# Patient Record
Sex: Male | Born: 1995 | Race: White | Hispanic: No | Marital: Single | Smoking: Never smoker
Health system: Southern US, Community
[De-identification: ages and names within clinical notes are randomized; demographics above are authoritative.]

## PROBLEM LIST (undated history)

## (undated) HISTORY — PX: WISDOM TOOTH EXTRACTION: SHX21

## (undated) HISTORY — PX: OTHER SURGICAL HISTORY: SHX169

---
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
## 2016-04-13 ENCOUNTER — Encounter (HOSPITAL_COMMUNITY): Payer: Self-pay | Admitting: Emergency Medicine

## 2016-04-13 ENCOUNTER — Emergency Department (HOSPITAL_COMMUNITY)
Admission: EM | Admit: 2016-04-13 | Discharge: 2016-04-13 | Disposition: A | Discharge status: Home / Self Care | Payer: 59 | Attending: Emergency Medicine | Admitting: Emergency Medicine

## 2016-04-13 DIAGNOSIS — R61 Generalized hyperhidrosis: Secondary | ICD-10-CM | POA: Diagnosis not present

## 2016-04-13 DIAGNOSIS — M791 Myalgia: Secondary | ICD-10-CM | POA: Diagnosis not present

## 2016-04-13 DIAGNOSIS — R55 Syncope and collapse: Secondary | ICD-10-CM | POA: Diagnosis not present

## 2016-04-13 DIAGNOSIS — R200 Anesthesia of skin: Secondary | ICD-10-CM | POA: Insufficient documentation

## 2016-04-13 LAB — CBC
HEMATOCRIT: 48.5 % (ref 39.0–52.0)
HEMOGLOBIN: 16.9 g/dL (ref 13.0–17.0)
MCH: 29.9 pg (ref 26.0–34.0)
MCHC: 34.8 g/dL (ref 30.0–36.0)
MCV: 85.7 fL (ref 78.0–100.0)
Platelets: 266 10*3/uL (ref 150–400)
RBC: 5.66 MIL/uL (ref 4.22–5.81)
RDW: 12.5 % (ref 11.5–15.5)
WBC: 18.5 10*3/uL — AB (ref 4.0–10.5)

## 2016-04-13 LAB — URINALYSIS, ROUTINE W REFLEX MICROSCOPIC
Bilirubin Urine: NEGATIVE
Glucose, UA: NEGATIVE mg/dL
Hgb urine dipstick: NEGATIVE
KETONES UR: NEGATIVE mg/dL
LEUKOCYTES UA: NEGATIVE
NITRITE: NEGATIVE
PH: 5 (ref 5.0–8.0)
Protein, ur: NEGATIVE mg/dL
SPECIFIC GRAVITY, URINE: 1.02 (ref 1.005–1.030)

## 2016-04-13 LAB — BASIC METABOLIC PANEL
ANION GAP: 6 (ref 5–15)
BUN: 15 mg/dL (ref 6–20)
CO2: 27 mmol/L (ref 22–32)
Calcium: 9.5 mg/dL (ref 8.9–10.3)
Chloride: 107 mmol/L (ref 101–111)
Creatinine, Ser: 1.1 mg/dL (ref 0.61–1.24)
GFR calc Af Amer: >60 mL/min (ref >60)
Glucose, Bld: 92 mg/dL (ref 65–99)
POTASSIUM: 4.3 mmol/L (ref 3.5–5.1)
SODIUM: 140 mmol/L (ref 135–145)

## 2016-04-13 LAB — CBG MONITORING, ED: GLUCOSE-CAPILLARY: 90 mg/dL (ref 65–99)

## 2016-04-13 NOTE — Discharge Instructions (Signed)
Do not exercise until evaluated by your family physician.

## 2016-04-13 NOTE — ED Triage Notes (Addendum)
Pt from home with his parents. Pt states he went to donate plasma today around 1330. Pt then went to the gym. Pt states at the gym, he began feeling tingling in his arms. Pt states he is unsure he actually experienced LOC or not. Per parents, EMS stated at the scene that he had experienced LOC.  Pt denies headache, dizziness, or other symptoms at this time  Pt states he also has many tick bites on his legs

## 2016-04-14 NOTE — ED Provider Notes (Signed)
[facility]-EMERGENCY DEPT Provider Note   CSN: [id number] Arrival date & time: 04/13/16  1848     History   Chief Complaint Chief Complaint  Patient presents with  . Loss of Consciousness    HPI Isaac Schmidt is a 20 y.o. male.  20 yo M with a chief complaint of a syncopal event. Patient donated plasma then went to the gym. While the gym patient was exerting himself and felt sick to his stomach. He went out a chair and then had to really focus on his breathing noticed that bilateral arms started having tingling and cramps. He then felt that time passed very quickly. EMS was notified and patient was noted to be pale and diaphoretic bradycardic and hypotensive on arrival. By my exam patient is asymptomatic.   The history is provided by the patient.  Loss of Consciousness   This is a new problem. The current episode started 3 to 5 hours ago. The problem occurs constantly. The problem has not changed since onset.He lost consciousness for a period of less than one minute. Associated symptoms include diaphoresis. Pertinent negatives include abdominal pain, chest pain, confusion, congestion, fever, headaches, palpitations and vomiting. He has tried nothing for the symptoms. The treatment provided significant relief.    History reviewed. No pertinent past medical history.  Patient Active Problem List   Diagnosis Date Noted  . Acute appendicitis 06/01/2011    Past Surgical History:  Procedure Laterality Date  . LAPAROSCOPIC APPENDECTOMY  06/01/2011   Procedure: APPENDECTOMY LAPAROSCOPIC;  Surgeon: Velora Heckler, MD;  Location: [facility]S;  Service: General;  Laterality: N/A;  . tubes in ear     both  . WISDOM TOOTH EXTRACTION    . WISDOM TOOTH EXTRACTION         Home Medications    Prior to Admission medications   Medication Sig Start Date End Date Taking? Authorizing Provider  MELATONIN PO Take 1 tablet by mouth at bedtime.   Yes Historical Provider, MD    Family History Family  History  Problem Relation Age of Onset  . Hypertension Mother   . Asthma Father     Social History Social History  Substance Use Topics  . Smoking status: Never Smoker  . Smokeless tobacco: Never Used  . Alcohol use No     Allergies   Amoxicillin-pot clavulanate   Review of Systems Review of Systems  Constitutional: Positive for diaphoresis. Negative for chills and fever.  HENT: Negative for congestion and facial swelling.   Eyes: Negative for discharge and visual disturbance.  Respiratory: Negative for shortness of breath.   Cardiovascular: Positive for syncope. Negative for chest pain and palpitations.  Gastrointestinal: Negative for abdominal pain, diarrhea and vomiting.  Musculoskeletal: Positive for myalgias. Negative for arthralgias.  Skin: Negative for color change and rash.  Neurological: Positive for syncope and numbness (tingling to all four extremities). Negative for tremors and headaches.  Psychiatric/Behavioral: Negative for confusion and dysphoric mood.     Physical Exam Updated Vital Signs BP 114/79 (BP Location: Right Arm)   Pulse (!) 56   Temp 98.3 F (36.8 C) (Oral)   Resp 18   Ht 6' (1.829 m)   Wt 207 lb (93.9 kg)   SpO2 98%   BMI 28.07 kg/m   Physical Exam  Constitutional: He is oriented to person, place, and time. He appears well-developed and well-nourished.  HENT:  Head: Normocephalic and atraumatic.  Eyes: EOM are normal. Pupils are equal, round, and reactive to light.  Neck: Normal range of motion. Neck supple. No JVD present.  Cardiovascular: Normal rate and regular rhythm.  Exam reveals no gallop and no friction rub.   No murmur heard. Pulmonary/Chest: No respiratory distress. He has no wheezes.  Abdominal: He exhibits no distension. There is no rebound and no guarding.  Musculoskeletal: Normal range of motion.  Neurological: He is alert and oriented to person, place, and time.  Skin: No rash noted. No pallor.  Psychiatric: He has  a normal mood and affect. His behavior is normal.  Nursing note and vitals reviewed.    ED Treatments / Results  Labs (all labs ordered are listed, but only abnormal results are displayed) Labs Reviewed  CBC - Abnormal; Notable for the following:       Result Value   WBC 18.5 (*)    All other components within normal limits  BASIC METABOLIC PANEL  URINALYSIS, ROUTINE W REFLEX MICROSCOPIC (NOT AT [facility])  CBG MONITORING, ED    EKG  EKG Interpretation  Date/Time:  Thursday April 13 2016 19:02:06 EDT Ventricular Rate:  87 PR Interval:    QRS Duration: 77 QT Interval:  342 QTC Calculation: 412 R Axis:   79 Text Interpretation:  Sinus rhythm ST elev, probable normal early repol pattern now wpw, prolonged qt brugada.  No old tracing to compare Confirmed by [facility] MD, DANIEL [phone]([id number]) on 04/13/2016 10:05:00 PM       Radiology No results found.  Procedures Procedures (including critical care time)  Medications Ordered in ED Medications - No data to display   Initial Impression / Assessment and Plan / ED Course  I have reviewed the triage vital signs and the nursing notes.  Pertinent labs & imaging results that were available during my care of the patient were reviewed by me and considered in my medical decision making (see chart for details).  Clinical Course    20 yo M With a chief complaint of syncopal event. By history it sounds like a panic attack. EKG with no significant findings. As patient had some symptoms during exercise recommended no further exercise until cleared by his family physician.  12:12 AM:  I have discussed the diagnosis/risks/treatment options with the patient and family and believe the pt to be eligible for discharge home to follow-up with PCP. We also discussed returning to the ED immediately if new or worsening sx occur. We discussed the sx which are most concerning (e.g., sudden worsening pain, fever, inability to tolerate by mouth) that  necessitate immediate return. Medications administered to the patient during their visit and any new prescriptions provided to the patient are listed below.  Medications given during this visit Medications - No data to display   The patient appears reasonably screen and/or stabilized for discharge and I doubt any other medical condition or other [facility] requiring further screening, evaluation, or treatment in the ED at this time prior to discharge.    Final Clinical Impressions(s) / ED Diagnoses   Final diagnoses:  Vasovagal syncope    New Prescriptions Discharge Medication List as of 04/13/2016 11:07 PM       Melene Planan Nyala Kirchner, DO 04/14/16 0012

## 2018-12-07 DIAGNOSIS — E669 Obesity, unspecified: Secondary | ICD-10-CM | POA: Diagnosis not present

## 2018-12-07 DIAGNOSIS — S30861A Insect bite (nonvenomous) of abdominal wall, initial encounter: Secondary | ICD-10-CM | POA: Diagnosis not present

## 2018-12-07 DIAGNOSIS — R21 Rash and other nonspecific skin eruption: Secondary | ICD-10-CM | POA: Diagnosis not present

## 2018-12-07 DIAGNOSIS — W57XXXA Bitten or stung by nonvenomous insect and other nonvenomous arthropods, initial encounter: Secondary | ICD-10-CM | POA: Diagnosis not present

## 2018-12-07 DIAGNOSIS — Z79899 Other long term (current) drug therapy: Secondary | ICD-10-CM | POA: Diagnosis not present

## 2019-03-14 DIAGNOSIS — L739 Follicular disorder, unspecified: Secondary | ICD-10-CM | POA: Diagnosis not present

## 2019-03-14 DIAGNOSIS — R21 Rash and other nonspecific skin eruption: Secondary | ICD-10-CM | POA: Diagnosis not present

## 2020-01-23 DIAGNOSIS — M79672 Pain in left foot: Secondary | ICD-10-CM | POA: Diagnosis not present

## 2020-02-10 DIAGNOSIS — M79672 Pain in left foot: Secondary | ICD-10-CM | POA: Diagnosis not present

## 2020-03-10 DIAGNOSIS — J45990 Exercise induced bronchospasm: Secondary | ICD-10-CM | POA: Diagnosis not present

## 2020-03-10 DIAGNOSIS — L729 Follicular cyst of the skin and subcutaneous tissue, unspecified: Secondary | ICD-10-CM | POA: Diagnosis not present

## 2020-05-24 DIAGNOSIS — L720 Epidermal cyst: Secondary | ICD-10-CM | POA: Diagnosis not present

## 2020-06-07 DIAGNOSIS — L7211 Pilar cyst: Secondary | ICD-10-CM | POA: Diagnosis not present

## 2021-05-10 DIAGNOSIS — S8002XD Contusion of left knee, subsequent encounter: Secondary | ICD-10-CM | POA: Diagnosis not present

## 2021-07-21 ENCOUNTER — Other Ambulatory Visit (HOSPITAL_BASED_OUTPATIENT_CLINIC_OR_DEPARTMENT_OTHER): Payer: Self-pay | Admitting: Orthopaedic Surgery

## 2021-07-22 ENCOUNTER — Other Ambulatory Visit (HOSPITAL_BASED_OUTPATIENT_CLINIC_OR_DEPARTMENT_OTHER): Payer: Self-pay | Admitting: Orthopaedic Surgery

## 2021-07-22 DIAGNOSIS — M25562 Pain in left knee: Secondary | ICD-10-CM

## 2021-07-26 ENCOUNTER — Ambulatory Visit (HOSPITAL_BASED_OUTPATIENT_CLINIC_OR_DEPARTMENT_OTHER)
Admission: RE | Admit: 2021-07-26 | Discharge: 2021-07-26 | Disposition: A | Discharge status: Home / Self Care | Payer: BC Managed Care – PPO | Source: Ambulatory Visit | Attending: Orthopaedic Surgery | Admitting: Orthopaedic Surgery

## 2021-07-26 ENCOUNTER — Ambulatory Visit (INDEPENDENT_AMBULATORY_CARE_PROVIDER_SITE_OTHER): Payer: BC Managed Care – PPO | Admitting: Orthopaedic Surgery

## 2021-07-26 ENCOUNTER — Other Ambulatory Visit: Payer: Self-pay

## 2021-07-26 DIAGNOSIS — M2392 Unspecified internal derangement of left knee: Secondary | ICD-10-CM | POA: Diagnosis not present

## 2021-07-26 DIAGNOSIS — M25562 Pain in left knee: Secondary | ICD-10-CM | POA: Insufficient documentation

## 2021-07-26 NOTE — Progress Notes (Signed)
Chief Complaint: Left knee pain     History of Present Illness:    Isaac Schmidt is a 26 y.o. male presents with medial based left knee pain since he was playing basketball and mid September.  At that time he states that the knee felt uncomfortable an hour after.  His knee has been focally medially based.  He denies any popping or specific clicking.  He does state that the knee is sore when he goes into full flexion of the knee.  He was prescribed physical therapy but was not able to attend.  He has been taking anti-inflammatories including meloxicam which is significantly helpful.  He denies any feeling of instability.  He is quite active and enjoys pickup basketball.  He works for [employer] History:   None  PMH/PSH/Family History/Social History/Meds/Allergies:   No past medical history on file. Past Surgical History:  Procedure Laterality Date   LAPAROSCOPIC APPENDECTOMY  06/01/2011   Procedure: APPENDECTOMY LAPAROSCOPIC;  Surgeon: Velora Heckler, MD;  Location: [facility];  Service: General;  Laterality: N/A;   tubes in ear     both   WISDOM TOOTH EXTRACTION     WISDOM TOOTH EXTRACTION     Social History   Socioeconomic History   Marital status: Single    Spouse name: Not on file   Number of children: Not on file   Years of education: Not on file   Highest education level: Not on file  Occupational History   Not on file  Tobacco Use   Smoking status: Never   Smokeless tobacco: Never  Substance and Sexual Activity   Alcohol use: No   Drug use: No   Sexual activity: Not Currently  Other Topics Concern   Not on file  Social History Narrative   Not on file   Social Determinants of Health   Financial Resource Strain: Not on file  Food Insecurity: Not on file  Transportation Needs: Not on file  Physical Activity: Not on file  Stress: Not on file  Social Connections: Not on file   Family History  Problem Relation Age  of Onset   Hypertension Mother    Asthma Father    Allergies  Allergen Reactions   Amoxicillin-Pot Clavulanate Rash   Current Outpatient Medications  Medication Sig Dispense Refill   MELATONIN PO Take 1 tablet by mouth at bedtime.     No current facility-administered medications for this visit.   No results found.  Review of Systems:   A ROS was performed including pertinent positives and negatives as documented in the HPI.  Physical Exam :   Constitutional: NAD and appears stated age Neurological: Alert and oriented Psych: Appropriate affect and cooperative There were no vitals taken for this visit.   Comprehensive Musculoskeletal Exam:     Musculoskeletal Exam  Gait Normal  Alignment Mild varus   Right Left  Inspection Normal Normal  Palpation    Tenderness None Medial joint  Crepitus None None  Effusion None Trace  Range of Motion    Extension 0 0  Flexion 135 135  Strength    Extension 5/5 5/5  Flexion 5/5 5/5  Ligament Exam     Generalized Laxity No No  Lachman Negative Negative   Pivot Shift Negative Negative  Anterior Drawer  Negative Negative  Valgus at 0 Negative Negative  Valgus at 20 Negative Negative  Varus at 0 0 0  Varus at 20   0 0  Posterior Drawer at 90 0 0  Vascular/Lymphatic Exam    Edema None None  Venous Stasis Changes No No  Distal Circulation Normal Normal  Neurologic    Light Touch Sensation Intact Intact  Special Tests: Tenderness about posterior medial joint space with deep flexion     Imaging:   Xray (4 views left knee): Normal   I personally reviewed and interpreted the radiographs.   Assessment:   26 year old male with left medial based knee pain after playing basketball in September.  Since this time the pain has not gone away.  He has trialed rest as well as activity restriction.  He is trialed NSAIDs including meloxicam without relief.  Given the fact he continues to be quite limited in activities and is not able to  play sport such as basketball would like to obtain an MRI of the left knee to further assess any type of underlying meniscal injury  Plan :    -Plan for left knee MRI  I believe that advance imaging in the form of an MRI is indicated for the following reasons: -Xrays images were obtained and not diagnostic -The patient has failed treatment modalities including ice, activity restriction, NSAIDs -The following worrisome symptoms are present on history and exam: Focal medial joint space tenderness       I personally saw and evaluated the patient, and participated in the management and treatment plan.  Huel Cote, MD Attending Physician, Orthopedic Surgery  This document was dictated using Dragon voice recognition software. A reasonable attempt at proof reading has been made to minimize errors.

## 2021-08-13 ENCOUNTER — Ambulatory Visit
Admission: RE | Admit: 2021-08-13 | Discharge: 2021-08-13 | Disposition: A | Discharge status: Home / Self Care | Payer: BC Managed Care – PPO | Source: Ambulatory Visit | Attending: Orthopaedic Surgery | Admitting: Orthopaedic Surgery

## 2021-08-13 ENCOUNTER — Other Ambulatory Visit: Payer: Self-pay

## 2021-08-13 DIAGNOSIS — M25562 Pain in left knee: Secondary | ICD-10-CM

## 2021-08-24 ENCOUNTER — Ambulatory Visit (HOSPITAL_BASED_OUTPATIENT_CLINIC_OR_DEPARTMENT_OTHER): Payer: BC Managed Care – PPO | Admitting: Orthopaedic Surgery

## 2021-08-25 ENCOUNTER — Ambulatory Visit (INDEPENDENT_AMBULATORY_CARE_PROVIDER_SITE_OTHER): Payer: BC Managed Care – PPO | Admitting: Orthopaedic Surgery

## 2021-08-25 ENCOUNTER — Other Ambulatory Visit: Payer: Self-pay

## 2021-08-25 DIAGNOSIS — E8889 Other specified metabolic disorders: Secondary | ICD-10-CM

## 2021-08-25 DIAGNOSIS — M25562 Pain in left knee: Secondary | ICD-10-CM

## 2021-08-25 MED ORDER — TRIAMCINOLONE ACETONIDE 40 MG/ML IJ SUSP
80.0000 mg | INTRAMUSCULAR | Status: AC | PRN
  Administered 2021-08-25: 80 mg via INTRA_ARTICULAR

## 2021-08-25 MED ORDER — LIDOCAINE HCL 1 % IJ SOLN
4.0000 mL | INTRAMUSCULAR | Status: AC | PRN
  Administered 2021-08-25: 4 mL

## 2021-08-25 NOTE — Progress Notes (Signed)
Chief Complaint: Left knee pain     History of Present Illness:   08/25/2021: Presents today for follow-up of the left knee and MRI review.  He states that the knee has persistently been painful and is little worsening since last follow-up.  He is here today for MRI discussion.  Isaac Schmidt is a 26 y.o. male presents with medial based left knee pain since he was playing basketball and mid September.  At that time he states that the knee felt uncomfortable an hour after.  His knee has been focally medially based.  He denies any popping or specific clicking.  He does state that the knee is sore when he goes into full flexion of the knee.  He was prescribed physical therapy but was not able to attend.  He has been taking anti-inflammatories including meloxicam which is significantly helpful.  He denies any feeling of instability.  He is quite active and enjoys pickup basketball.  He works for [employer] History:   None  PMH/PSH/Family History/Social History/Meds/Allergies:   No past medical history on file. Past Surgical History:  Procedure Laterality Date   LAPAROSCOPIC APPENDECTOMY  06/01/2011   Procedure: APPENDECTOMY LAPAROSCOPIC;  Surgeon: Velora Heckler, MD;  Location: [facility];  Service: General;  Laterality: N/A;   tubes in ear     both   WISDOM TOOTH EXTRACTION     WISDOM TOOTH EXTRACTION     Social History   Socioeconomic History   Marital status: Single    Spouse name: Not on file   Number of children: Not on file   Years of education: Not on file   Highest education level: Not on file  Occupational History   Not on file  Tobacco Use   Smoking status: Never   Smokeless tobacco: Never  Substance and Sexual Activity   Alcohol use: No   Drug use: No   Sexual activity: Not Currently  Other Topics Concern   Not on file  Social History Narrative   Not on file   Social Determinants of Health   Financial  Resource Strain: Not on file  Food Insecurity: Not on file  Transportation Needs: Not on file  Physical Activity: Not on file  Stress: Not on file  Social Connections: Not on file   Family History  Problem Relation Age of Onset   Hypertension Mother    Asthma Father    Allergies  Allergen Reactions   Amoxicillin-Pot Clavulanate Rash   Current Outpatient Medications  Medication Sig Dispense Refill   MELATONIN PO Take 1 tablet by mouth at bedtime.     No current facility-administered medications for this visit.   No results found.  Review of Systems:   A ROS was performed including pertinent positives and negatives as documented in the HPI.  Physical Exam :   Constitutional: NAD and appears stated age Neurological: Alert and oriented Psych: Appropriate affect and cooperative There were no vitals taken for this visit.   Comprehensive Musculoskeletal Exam:     Musculoskeletal Exam  Gait Normal  Alignment Mild varus   Right Left  Inspection Normal Normal  Palpation    Tenderness None Medial lateral patellar  Crepitus None None  Effusion None Trace  Range of Motion    Extension 0 0  Flexion  135 135  Strength    Extension 5/5 5/5  Flexion 5/5 5/5  Ligament Exam     Generalized Laxity No No  Lachman Negative Negative   Pivot Shift Negative Negative  Anterior Drawer Negative Negative  Valgus at 0 Negative Negative  Valgus at 20 Negative Negative  Varus at 0 0 0  Varus at 20   0 0  Posterior Drawer at 90 0 0  Vascular/Lymphatic Exam    Edema None None  Venous Stasis Changes No No  Distal Circulation Normal Normal  Neurologic    Light Touch Sensation Intact Intact  Special Tests: tenderness about Hoffa's fat pad with hypertrophied fat pad     Imaging:   Xray (4 views left knee): Normal  MRI left knee: Significant Hoffa fat pad hypertrophy with extension into the trochlea consistent with possible impingement  I personally reviewed and interpreted  the radiographs.   Assessment:   26 year old male with left knee pain consistent with Hoffa fat pad inflammation and hypertrophy.  I did discuss I would ultimately like to perform a Hoffa fat pad injection at today's visit in order to further confirm the diagnosis.  I did discuss overall good prognosis of this with nonoperative management given that the problem is significantly related to inflammation of the fat pad which can typically be treated with injection.  Plan :    -Plan for left knee ultrasound-guided Hoffa fat pad injection after verbal consent obtained    Procedure Note  Patient: Isaac Schmidt             Date of Birth: Dec 01, 1995           MRN: [id number]             Visit Date: 08/25/2021  Procedures: Visit Diagnoses: No diagnosis found.  Large Joint Inj: L knee on 08/25/2021 2:06 PM Indications: pain Details: 22 G 1.5 in needle, ultrasound-guided anterior approach  Arthrogram: No  Medications: 4 mL lidocaine 1 %; 80 mg triamcinolone acetonide 40 MG/ML Outcome: tolerated well, no immediate complications Procedure, treatment alternatives, risks and benefits explained, specific risks discussed. Consent was given by the patient. Immediately prior to procedure a time out was called to verify the correct patient, procedure, equipment, support staff and site/side marked as required. Patient was prepped and draped in the usual sterile fashion.            I personally saw and evaluated the patient, and participated in the management and treatment plan.  Huel Cote, MD Attending Physician, Orthopedic Surgery  This document was dictated using Dragon voice recognition software. A reasonable attempt at proof reading has been made to minimize errors.

## 2021-12-11 ENCOUNTER — Encounter (HOSPITAL_BASED_OUTPATIENT_CLINIC_OR_DEPARTMENT_OTHER): Payer: Self-pay | Admitting: Orthopaedic Surgery

## 2022-04-07 DIAGNOSIS — Z0000 Encounter for general adult medical examination without abnormal findings: Secondary | ICD-10-CM | POA: Diagnosis not present

## 2022-04-07 DIAGNOSIS — Z23 Encounter for immunization: Secondary | ICD-10-CM | POA: Diagnosis not present

## 2022-04-14 DIAGNOSIS — N50811 Right testicular pain: Secondary | ICD-10-CM | POA: Diagnosis not present

## 2022-04-14 DIAGNOSIS — E782 Mixed hyperlipidemia: Secondary | ICD-10-CM | POA: Diagnosis not present

## 2022-04-14 DIAGNOSIS — Z5181 Encounter for therapeutic drug level monitoring: Secondary | ICD-10-CM | POA: Diagnosis not present

## 2022-04-17 ENCOUNTER — Other Ambulatory Visit (HOSPITAL_BASED_OUTPATIENT_CLINIC_OR_DEPARTMENT_OTHER): Payer: Self-pay | Admitting: Family Medicine

## 2022-04-17 DIAGNOSIS — N50811 Right testicular pain: Secondary | ICD-10-CM

## 2023-04-12 DIAGNOSIS — E782 Mixed hyperlipidemia: Secondary | ICD-10-CM | POA: Diagnosis not present

## 2023-04-12 DIAGNOSIS — Z0000 Encounter for general adult medical examination without abnormal findings: Secondary | ICD-10-CM | POA: Diagnosis not present

## 2023-04-12 DIAGNOSIS — Z23 Encounter for immunization: Secondary | ICD-10-CM | POA: Diagnosis not present

## 2023-04-24 DIAGNOSIS — D485 Neoplasm of uncertain behavior of skin: Secondary | ICD-10-CM | POA: Diagnosis not present

## 2023-04-24 DIAGNOSIS — D225 Melanocytic nevi of trunk: Secondary | ICD-10-CM | POA: Diagnosis not present

## 2023-04-24 DIAGNOSIS — L814 Other melanin hyperpigmentation: Secondary | ICD-10-CM | POA: Diagnosis not present

## 2023-05-15 DIAGNOSIS — R590 Localized enlarged lymph nodes: Secondary | ICD-10-CM | POA: Diagnosis not present

## 2023-05-17 DIAGNOSIS — D485 Neoplasm of uncertain behavior of skin: Secondary | ICD-10-CM | POA: Diagnosis not present

## 2023-05-17 DIAGNOSIS — L988 Other specified disorders of the skin and subcutaneous tissue: Secondary | ICD-10-CM | POA: Diagnosis not present

## 2023-10-23 DIAGNOSIS — R29898 Other symptoms and signs involving the musculoskeletal system: Secondary | ICD-10-CM | POA: Diagnosis not present

## 2023-10-23 DIAGNOSIS — M542 Cervicalgia: Secondary | ICD-10-CM | POA: Diagnosis not present

## 2023-10-23 DIAGNOSIS — E782 Mixed hyperlipidemia: Secondary | ICD-10-CM | POA: Diagnosis not present

## 2023-11-13 DIAGNOSIS — M542 Cervicalgia: Secondary | ICD-10-CM | POA: Diagnosis not present

## 2023-11-13 DIAGNOSIS — R29898 Other symptoms and signs involving the musculoskeletal system: Secondary | ICD-10-CM | POA: Diagnosis not present

## 2023-11-21 ENCOUNTER — Ambulatory Visit: Admitting: Podiatry

## 2023-11-23 DIAGNOSIS — M542 Cervicalgia: Secondary | ICD-10-CM | POA: Diagnosis not present

## 2023-11-23 DIAGNOSIS — R29898 Other symptoms and signs involving the musculoskeletal system: Secondary | ICD-10-CM | POA: Diagnosis not present

## 2023-11-28 ENCOUNTER — Encounter: Payer: Self-pay | Admitting: Podiatry

## 2023-11-28 ENCOUNTER — Ambulatory Visit (INDEPENDENT_AMBULATORY_CARE_PROVIDER_SITE_OTHER): Admitting: Podiatry

## 2023-11-28 VITALS — Ht 72.0 in | Wt 207.0 lb

## 2023-11-28 DIAGNOSIS — M7752 Other enthesopathy of left foot: Secondary | ICD-10-CM

## 2023-11-28 DIAGNOSIS — S134XXA Sprain of ligaments of cervical spine, initial encounter: Secondary | ICD-10-CM | POA: Diagnosis not present

## 2023-11-28 MED ORDER — METHYLPREDNISOLONE 4 MG PO TBPK: ORAL_TABLET | ORAL | 0 refills | Status: DC

## 2023-11-28 NOTE — Progress Notes (Signed)
   Chief Complaint  Patient presents with   Foot Pain    RM6: patient is here for achillies issues and left 5th toe pain    HPI: 28 y.o. male presenting today as a new patient for evaluation of chronic pain and tenderness associated to the left foot lateral column ongoing for a few months now.  Idiopathic onset.  He believes it was possibly simply an overuse injury.  For the last few weeks he is really been resting his foot and he feels significantly better.  History reviewed. No pertinent past medical history.  Past Surgical History:  Procedure Laterality Date   LAPAROSCOPIC APPENDECTOMY  06/01/2011   Procedure: APPENDECTOMY LAPAROSCOPIC;  Surgeon: Keitha Pata, MD;  Location: [facility];  Service: General;  Laterality: N/A;   tubes in ear     both   WISDOM TOOTH EXTRACTION     WISDOM TOOTH EXTRACTION      Allergies  Allergen Reactions   Amoxicillin-Pot Clavulanate Rash     Physical Exam: General: The patient is alert and oriented x3 in no acute distress.  Dermatology: Skin is warm, dry and supple bilateral lower extremities.   Vascular: Palpable pedal pulses bilaterally. Capillary refill within normal limits.  No appreciable edema.  No erythema.  Neurological: Grossly intact via light touch  Musculoskeletal Exam: No pedal deformities noted.  There is some minimal tenderness with palpation around the lateral aspect of the left foot just plantar to the fifth metatarsal today  Assessment/Plan of Care: 1.  Mild peroneal tendinitis left 2.  Likely abductor digiti minimi muscle belly strain left  -Patient evaluated -Currently the pain is only 1/10 on a daily basis.  He has not really had any tenderness to the adductor digiti minimi for a few weeks now simply by resting the foot. -No injection administered today -Prescription for Medrol Dosepak -Slowly increase to full activity with no restrictions over the following few weeks -Continue wearing good supportive tennis shoes and  sneakers.  Advise against going barefoot or socks only around the house -Return to clinic as needed     Dot Gazella, DPM [facility]  Dr. Dot Gazella, DPM    [address] [place], [place] [address]                Office [phone]  Fax [phone]

## 2024-04-23 DIAGNOSIS — Z0000 Encounter for general adult medical examination without abnormal findings: Secondary | ICD-10-CM | POA: Diagnosis not present

## 2024-04-23 DIAGNOSIS — E782 Mixed hyperlipidemia: Secondary | ICD-10-CM | POA: Diagnosis not present

## 2024-04-28 ENCOUNTER — Ambulatory Visit (INDEPENDENT_AMBULATORY_CARE_PROVIDER_SITE_OTHER)

## 2024-04-28 ENCOUNTER — Ambulatory Visit: Admitting: Podiatry

## 2024-04-28 ENCOUNTER — Encounter: Payer: Self-pay | Admitting: Podiatry

## 2024-04-28 VITALS — Ht 72.0 in | Wt 207.0 lb

## 2024-04-28 DIAGNOSIS — M7662 Achilles tendinitis, left leg: Secondary | ICD-10-CM

## 2024-04-28 DIAGNOSIS — M7752 Other enthesopathy of left foot: Secondary | ICD-10-CM

## 2024-04-28 MED ORDER — METHYLPREDNISOLONE 4 MG PO TBPK: ORAL_TABLET | ORAL | 0 refills | Status: AC

## 2024-04-28 NOTE — Progress Notes (Signed)
   Chief Complaint  Patient presents with   Foot Pain    Pt is here due to left foot pain, states the pain is at the heel of the foot, sensitive to touch, worries about instability, wants to know what he can do to help this issue.     HPI: 28 y.o. male presenting today for new complaint of pain and tenderness associated to the left posterior heel.  It could become symptomatic with activity.  He is very active and exercises and enjoys trail running and sports  No past medical history on file.  Past Surgical History:  Procedure Laterality Date   LAPAROSCOPIC APPENDECTOMY  06/01/2011   Procedure: APPENDECTOMY LAPAROSCOPIC;  Surgeon: Krystal CHRISTELLA Spinner, MD;  Location: [facility];  Service: General;  Laterality: N/A;   tubes in ear     both   WISDOM TOOTH EXTRACTION     WISDOM TOOTH EXTRACTION      Allergies  Allergen Reactions   Amoxicillin-Pot Clavulanate Rash     Physical Exam: General: The patient is alert and oriented x3 in no acute distress.  Dermatology: Skin is warm, dry and supple bilateral lower extremities.   Vascular: Palpable pedal pulses bilaterally. Capillary refill within normal limits.  No appreciable edema.  No erythema.  Neurological: Grossly intact via light touch  Musculoskeletal Exam: Tenderness to palpation noted to the posterior tubercle of the calcaneus left  Radiographic Exam LT foot 04/28/2024:  Normal osseous mineralization. Joint spaces preserved.  No fractures or osseous irregularities noted.  Impression: Negative  Assessment/Plan of Care: 1.  Insertional Achilles tendinitis left  -Patient evaluated.  X-rays reviewed -Recommend conservative care management for moment.  Recommend reducing activity and advised against activity that is strenuous to the Achilles tendon; especially uphill exercises or walking on a treadmill -Recommend daily stretching to the calf muscle to alleviate tightness to the posterior complex of the leg -Prescription for Medrol   Dosepak -Continue wearing good supportive tennis shoes and sneakers -Return to clinic PRN       Thresa EMERSON Sar, DPM [facility]  Dr. Thresa EMERSON Sar, DPM    [address] [place], [place] [address]                Office [phone]  Fax [phone]

## 2024-05-05 ENCOUNTER — Ambulatory Visit: Admitting: Podiatry

## 2024-08-06 ENCOUNTER — Other Ambulatory Visit (HOSPITAL_BASED_OUTPATIENT_CLINIC_OR_DEPARTMENT_OTHER): Payer: Self-pay

## 2024-08-06 MED ORDER — ALBUTEROL SULFATE HFA 108 (90 BASE) MCG/ACT IN AERS
INHALATION_SPRAY | RESPIRATORY_TRACT | 0 refills | Status: AC
  Filled 2024-08-06: qty 8.5, 30d supply, fill #0

## 2024-08-06 MED ORDER — TRIAMCINOLONE ACETONIDE 0.1 % EX OINT
TOPICAL_OINTMENT | CUTANEOUS | 1 refills | Status: AC
  Filled 2024-08-06: qty 30, 30d supply, fill #0
